# Patient Record
Sex: Female | Born: 2000 | Race: White | Hispanic: No | Marital: Single | Smoking: Never smoker
Health system: Southern US, Community
[De-identification: ages and names within clinical notes are randomized; demographics above are authoritative.]

---
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
## 2020-03-02 ENCOUNTER — Emergency Department: Payer: 59

## 2020-03-02 ENCOUNTER — Encounter: Payer: Self-pay | Admitting: Emergency Medicine

## 2020-03-02 ENCOUNTER — Other Ambulatory Visit: Payer: Self-pay

## 2020-03-02 DIAGNOSIS — M25572 Pain in left ankle and joints of left foot: Secondary | ICD-10-CM | POA: Diagnosis present

## 2020-03-02 DIAGNOSIS — Z5321 Procedure and treatment not carried out due to patient leaving prior to being seen by health care provider: Secondary | ICD-10-CM | POA: Diagnosis not present

## 2020-03-02 NOTE — ED Triage Notes (Signed)
Pt to ED from work c/o mechanical fall, walking and stepped off of curb.  Has not been able to bear weight.

## 2020-03-03 ENCOUNTER — Emergency Department
Admission: EM | Admit: 2020-03-03 | Discharge: 2020-03-03 | Discharge status: Against Medical Advice | Payer: 59 | Attending: Emergency Medicine | Admitting: Emergency Medicine

## 2020-03-05 ENCOUNTER — Telehealth: Payer: Self-pay | Admitting: Emergency Medicine

## 2020-03-05 NOTE — Telephone Encounter (Signed)
Called patient due to lwot to inquire about condition and follow up plans. Says she went to ortho urgent care in [place].

## 2020-03-16 ENCOUNTER — Ambulatory Visit: Payer: 59 | Attending: Internal Medicine

## 2020-03-16 DIAGNOSIS — Z23 Encounter for immunization: Secondary | ICD-10-CM

## 2020-03-16 NOTE — Progress Notes (Signed)
   Covid-19 Vaccination Clinic  Name:  Katherine Gill    MRN: [id number] DOB: 01-Nov-2001  03/16/2020  [name] was observed post Covid-19 immunization for 15 minutes without incident. She was provided with Vaccine Information Sheet and instruction to access the V-Safe system.   [name] was instructed to call 911 with any severe reactions post vaccine: Marland Kitchen Difficulty breathing  . Swelling of face and throat  . A fast heartbeat  . A bad rash all over body  . Dizziness and weakness   Immunizations Administered    Name Date Dose VIS Date Route   Pfizer COVID-19 Vaccine 03/16/2020 12:43 PM 0.3 mL 12/06/2019 Intramuscular   Manufacturer: [employer], [employer]   Lot: [id number]   NDC: [address]-6

## 2020-04-06 ENCOUNTER — Ambulatory Visit: Payer: 59 | Attending: Internal Medicine

## 2020-04-06 DIAGNOSIS — Z23 Encounter for immunization: Secondary | ICD-10-CM

## 2020-04-06 NOTE — Progress Notes (Signed)
   Covid-19 Vaccination Clinic  Name:  Margan Elias    MRN: [id number] DOB: Nov 29, 2001  04/06/2020  Ms. Depaul was observed post Covid-19 immunization for 15 minutes without incident. She was provided with Vaccine Information Sheet and instruction to access the V-Safe system.   Ms. Gritton was instructed to call 911 with any severe reactions post vaccine: Marland Kitchen Difficulty breathing  . Swelling of face and throat  . A fast heartbeat  . A bad rash all over body  . Dizziness and weakness   Immunizations Administered    Name Date Dose VIS Date Route   Pfizer COVID-19 Vaccine 04/06/2020 11:58 AM 0.3 mL 12/06/2019 Intramuscular   Manufacturer: [employer], [employer]   Lot: [id number]   NDC: [address]-0

## 2021-02-18 IMAGING — CR DG ANKLE COMPLETE 3+V*L*
3 series · 3 of 3 positions shown · non-contrast
Comparison: None.

CLINICAL DATA: Injury

EXAM:
LEFT ANKLE COMPLETE - 3+ VIEW

[ankle ap]
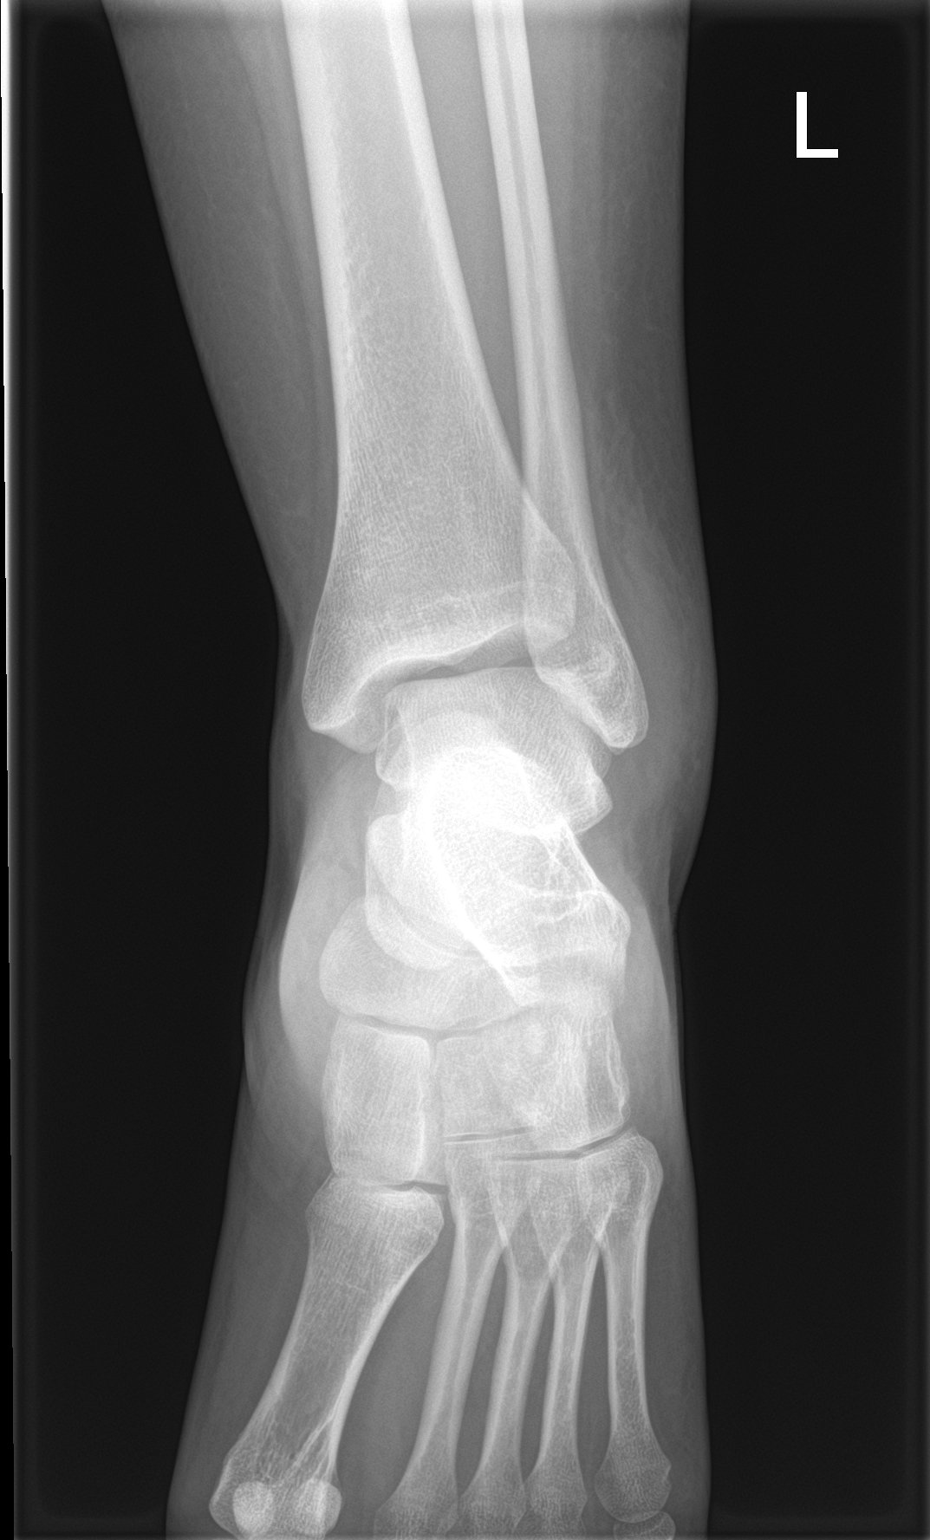

[ankle obl]
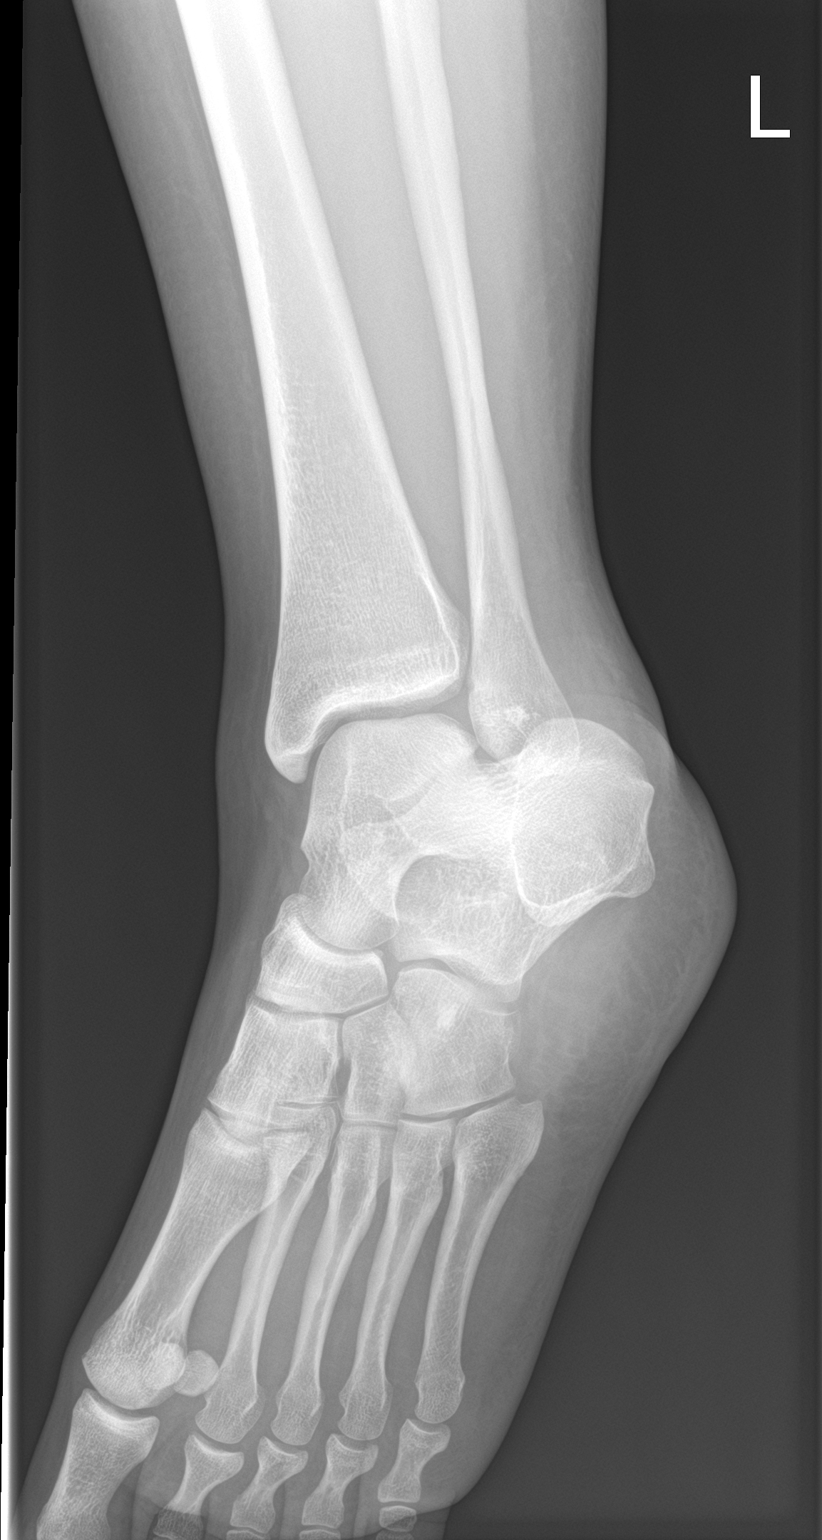

[ankle lat]
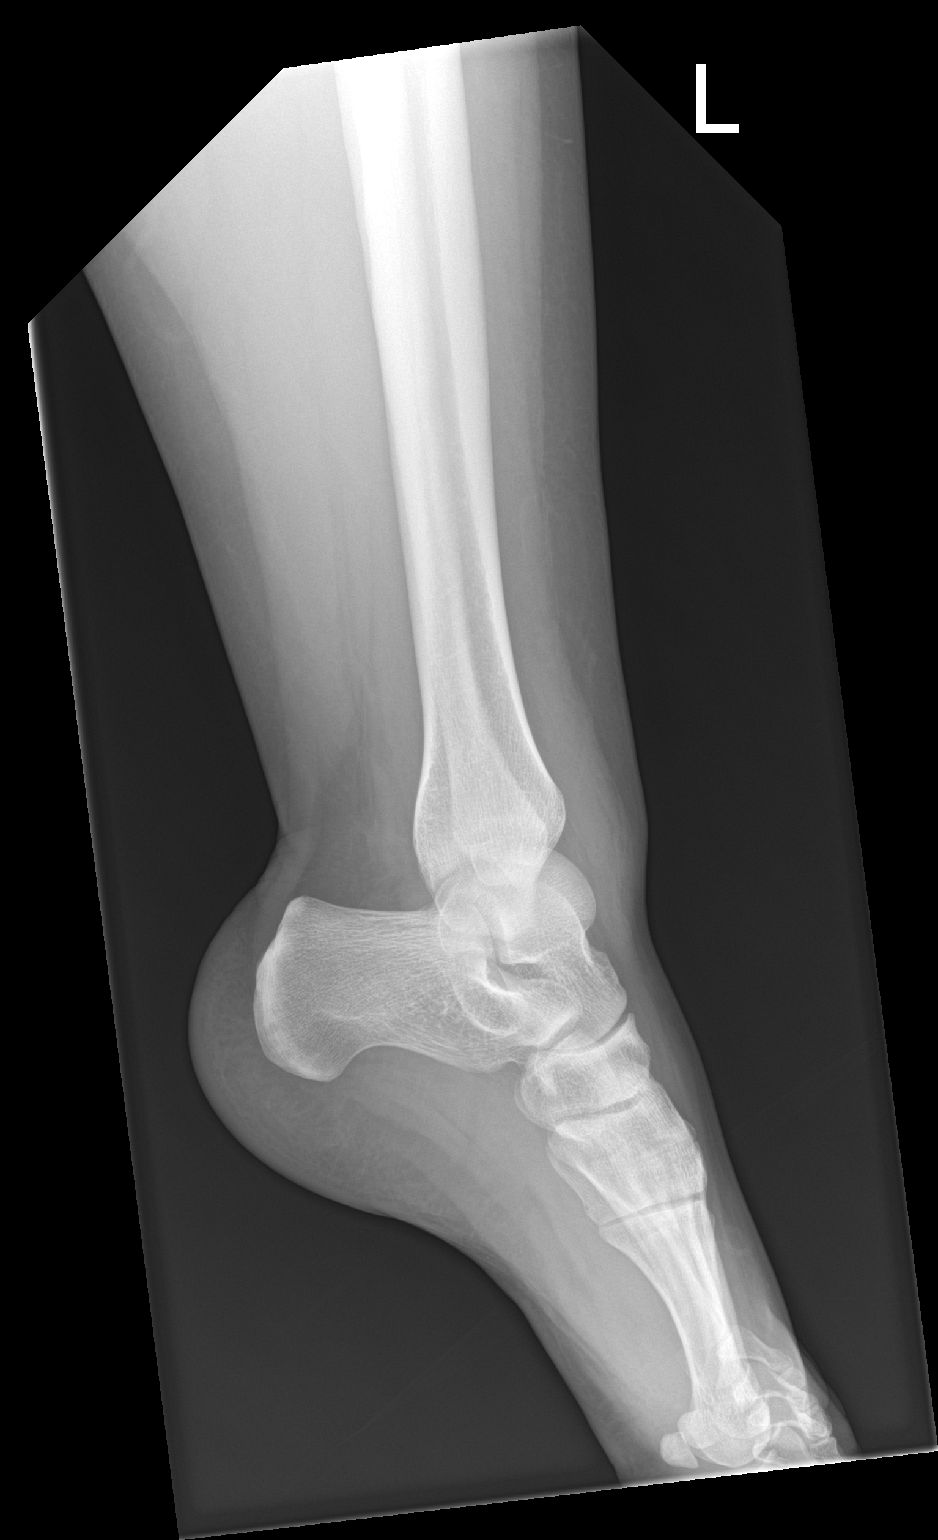

[3 of 3 positions shown; findings below may reference images not displayed]

FINDINGS: No fracture is identified. There appears to be slight asymmetric
widening of the superolateral mortise.
IMPRESSION: Slightly widened appearance of superolateral mortise suggesting
ligamentous injury. No discrete fracture is seen
